# Patient Record
Sex: Female | Born: 1965 | Race: White | Hispanic: No | Marital: Married | Smoking: Current some day smoker
Health system: Southern US, Community
[De-identification: ages and names within clinical notes are randomized; demographics above are authoritative.]

## PROBLEM LIST (undated history)

## (undated) DIAGNOSIS — Z9889 Other specified postprocedural states: Secondary | ICD-10-CM

## (undated) DIAGNOSIS — K5190 Ulcerative colitis, unspecified, without complications: Secondary | ICD-10-CM

## (undated) DIAGNOSIS — Z8679 Personal history of other diseases of the circulatory system: Secondary | ICD-10-CM

## (undated) DIAGNOSIS — K5792 Diverticulitis of intestine, part unspecified, without perforation or abscess without bleeding: Secondary | ICD-10-CM

## (undated) DIAGNOSIS — N200 Calculus of kidney: Secondary | ICD-10-CM

## (undated) DIAGNOSIS — K5790 Diverticulosis of intestine, part unspecified, without perforation or abscess without bleeding: Secondary | ICD-10-CM

## (undated) DIAGNOSIS — F419 Anxiety disorder, unspecified: Secondary | ICD-10-CM

## (undated) HISTORY — DX: Ulcerative colitis, unspecified, without complications: K51.90

## (undated) HISTORY — DX: Diverticulosis of intestine, part unspecified, without perforation or abscess without bleeding: K57.90

## (undated) HISTORY — DX: Anxiety disorder, unspecified: F41.9

## (undated) HISTORY — DX: Calculus of kidney: N20.0

## (undated) HISTORY — DX: Diverticulitis of intestine, part unspecified, without perforation or abscess without bleeding: K57.92

## (undated) HISTORY — PX: OTHER SURGICAL HISTORY: SHX169

---

## 1998-09-05 ENCOUNTER — Other Ambulatory Visit: Admission: RE | Admit: 1998-09-05 | Discharge: 1998-09-05 | Payer: Self-pay | Admitting: Obstetrics & Gynecology

## 1999-09-09 ENCOUNTER — Other Ambulatory Visit: Admission: RE | Admit: 1999-09-09 | Discharge: 1999-09-09 | Payer: Self-pay | Admitting: Obstetrics & Gynecology

## 2000-09-14 ENCOUNTER — Other Ambulatory Visit: Admission: RE | Admit: 2000-09-14 | Discharge: 2000-09-14 | Payer: Self-pay | Admitting: Obstetrics & Gynecology

## 2001-10-04 ENCOUNTER — Other Ambulatory Visit: Admission: RE | Admit: 2001-10-04 | Discharge: 2001-10-04 | Payer: Self-pay | Admitting: Obstetrics & Gynecology

## 2002-09-05 ENCOUNTER — Other Ambulatory Visit: Admission: RE | Admit: 2002-09-05 | Discharge: 2002-09-05 | Payer: Self-pay | Admitting: Obstetrics & Gynecology

## 2003-09-06 ENCOUNTER — Other Ambulatory Visit: Admission: RE | Admit: 2003-09-06 | Discharge: 2003-09-06 | Payer: Self-pay | Admitting: Obstetrics & Gynecology

## 2004-09-13 ENCOUNTER — Other Ambulatory Visit: Admission: RE | Admit: 2004-09-13 | Discharge: 2004-09-13 | Payer: Self-pay | Admitting: Obstetrics & Gynecology

## 2005-02-18 ENCOUNTER — Ambulatory Visit: Payer: Self-pay | Admitting: Gastroenterology

## 2005-02-27 ENCOUNTER — Ambulatory Visit: Payer: Self-pay | Admitting: Gastroenterology

## 2005-03-03 ENCOUNTER — Encounter (INDEPENDENT_AMBULATORY_CARE_PROVIDER_SITE_OTHER): Payer: Self-pay | Admitting: Specialist

## 2005-03-03 ENCOUNTER — Ambulatory Visit: Payer: Self-pay | Admitting: Gastroenterology

## 2005-03-04 ENCOUNTER — Ambulatory Visit: Payer: Self-pay | Admitting: Gastroenterology

## 2005-04-01 ENCOUNTER — Ambulatory Visit: Payer: Self-pay | Admitting: Gastroenterology

## 2005-09-16 ENCOUNTER — Other Ambulatory Visit: Admission: RE | Admit: 2005-09-16 | Discharge: 2005-09-16 | Payer: Self-pay | Admitting: Obstetrics & Gynecology

## 2007-09-24 ENCOUNTER — Ambulatory Visit: Payer: Self-pay | Admitting: Gastroenterology

## 2007-09-24 LAB — CONVERTED CEMR LAB
ALT: 19 units/L (ref 0–35)
AST: 19 units/L (ref 0–37)
Albumin: 4.4 g/dL (ref 3.5–5.2)
BUN: 16 mg/dL (ref 6–23)
Basophils Relative: 0.7 % (ref 0.0–1.0)
CO2: 32 meq/L (ref 19–32)
Chloride: 104 meq/L (ref 96–112)
Creatinine, Ser: 0.7 mg/dL (ref 0.4–1.2)
Eosinophils Relative: 1.8 % (ref 0.0–5.0)
Ferritin: 21.1 ng/mL (ref 10.0–291.0)
HCT: 42 % (ref 36.0–46.0)
Hemoglobin: 13.9 g/dL (ref 12.0–15.0)
Iron: 76 ug/dL (ref 42–145)
Monocytes Absolute: 0.4 10*3/uL (ref 0.1–1.0)
Monocytes Relative: 5.1 % (ref 3.0–12.0)
Neutro Abs: 3.9 10*3/uL (ref 1.4–7.7)
RBC: 4.26 M/uL (ref 3.87–5.11)
Tissue Transglutaminase Ab, IgA: 0.7 units (ref <7)
WBC: 6.9 10*3/uL (ref 4.5–10.5)

## 2007-10-06 ENCOUNTER — Encounter: Payer: Self-pay | Admitting: Gastroenterology

## 2007-10-06 ENCOUNTER — Ambulatory Visit: Payer: Self-pay | Admitting: Gastroenterology

## 2010-11-05 NOTE — Assessment & Plan Note (Signed)
[facility]                         GASTROENTEROLOGY OFFICE NOTE   Debbie, [name]                      MRN:          [id number]  DATE:09/24/2007                            DOB:          1965-09-27    The patient has not been seen since September - October of 2006 when she  was felt to have possible underlying inflammatory bowel disease and was  treated with Pentasa with good response.  Actually on reviewing her  chart, her biopsies from March 03, 2005, showed changes of  lymphocytic colitis and her ileal biopsies were normal.  She does have a  family history of multiple family members with diverticulitis but no  known IBD.  Her IBD serologies at that time were positive and consistent  with Crohn's disease with a positive ASCA IgA.   She has continued with crampy abdominal pain and periodic diarrhea.  Most of her pain is in the left upper quadrant radiating to her back  with associated gas and bloating.  She has frequent soft stools and at  times does become constipated, but has denied melena or hematochezia.  No upper GI or hepatobiliary complaints, but has had some gradual slow  weight loss and general malaise.  A lot of her symptoms have been  exacerbated by stress at work.  She denies skin rash, joint pains, oral  stomatitis, or visual difficulty.  She has had no foreign travel, known  infectious disease exposure, or trauma to her side.  She relates  recently she was at [facility] and seen by a urologist and had  a negative CT scan of the abdomen.   The patient does use a large amount of NSAIDS which may be exacerbating  her problems.  Her health otherwise is really fairly unremarkable  although she is on Allegra twice a day, Tramadol p.r.n., and calcium  supplementation.   PHYSICAL EXAMINATION:  GENERAL:  She is an attractive, healthy appearing  white female in no distress appearing her stated age.  VITAL SIGNS:  She weighs  143 pounds which is down some 15 pounds from  her normal weight.  Blood pressure 116/76, and pulse 88 and regular.  I  could not appreciate stigmata of chronic liver disease.  ABDOMEN:  Entirely benign without organomegaly, masses, or tenderness,  or CVA tenderness.  Bowel sounds were not obstructive.  RECTAL:  Inspection of rectum was unremarkable as was rectal examination  with soft stool that was guaiac negative.   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   RECOMMENDATIONS:  1. Check multiple screening laboratory parameters including celiac      panel.  2. Follow-up colonoscopy examination and colon biopsies.  3. I have started Entocort 9 mg a day with p.r.n. Levsin and low fiber      diet.     Debbie Rea. [name], MD, [name], [name]  Electronically Signed    DRP/MedQ  DD: 09/24/2007  DT: 09/24/2007  Job #: [id number]

## 2012-06-19 ENCOUNTER — Emergency Department (HOSPITAL_BASED_OUTPATIENT_CLINIC_OR_DEPARTMENT_OTHER)
Admission: EM | Admit: 2012-06-19 | Discharge: 2012-06-19 | Disposition: A | Discharge status: Home / Self Care | Payer: BC Managed Care – PPO | Attending: Emergency Medicine | Admitting: Emergency Medicine

## 2012-06-19 ENCOUNTER — Emergency Department (HOSPITAL_BASED_OUTPATIENT_CLINIC_OR_DEPARTMENT_OTHER): Payer: BC Managed Care – PPO

## 2012-06-19 ENCOUNTER — Encounter (HOSPITAL_BASED_OUTPATIENT_CLINIC_OR_DEPARTMENT_OTHER): Payer: Self-pay | Admitting: *Deleted

## 2012-06-19 DIAGNOSIS — K5732 Diverticulitis of large intestine without perforation or abscess without bleeding: Secondary | ICD-10-CM | POA: Insufficient documentation

## 2012-06-19 DIAGNOSIS — F17200 Nicotine dependence, unspecified, uncomplicated: Secondary | ICD-10-CM | POA: Insufficient documentation

## 2012-06-19 DIAGNOSIS — Z87442 Personal history of urinary calculi: Secondary | ICD-10-CM | POA: Insufficient documentation

## 2012-06-19 DIAGNOSIS — Z79899 Other long term (current) drug therapy: Secondary | ICD-10-CM | POA: Insufficient documentation

## 2012-06-19 DIAGNOSIS — R110 Nausea: Secondary | ICD-10-CM | POA: Insufficient documentation

## 2012-06-19 DIAGNOSIS — Z3202 Encounter for pregnancy test, result negative: Secondary | ICD-10-CM | POA: Insufficient documentation

## 2012-06-19 DIAGNOSIS — Z8719 Personal history of other diseases of the digestive system: Secondary | ICD-10-CM | POA: Insufficient documentation

## 2012-06-19 HISTORY — DX: Personal history of other diseases of the circulatory system: Z86.79

## 2012-06-19 HISTORY — DX: Other specified postprocedural states: Z98.890

## 2012-06-19 LAB — CBC
MCH: 33.3 pg (ref 26.0–34.0)
MCHC: 33.8 g/dL (ref 30.0–36.0)
Platelets: 223 10*3/uL (ref 150–400)
RBC: 4.21 MIL/uL (ref 3.87–5.11)
RDW: 12.5 % (ref 11.5–15.5)

## 2012-06-19 LAB — COMPREHENSIVE METABOLIC PANEL
ALT: 19 U/L (ref 0–35)
AST: 22 U/L (ref 0–37)
Albumin: 4.8 g/dL (ref 3.5–5.2)
CO2: 26 mEq/L (ref 19–32)
Calcium: 10.1 mg/dL (ref 8.4–10.5)
Sodium: 138 mEq/L (ref 135–145)
Total Protein: 8.2 g/dL (ref 6.0–8.3)

## 2012-06-19 LAB — URINALYSIS, ROUTINE W REFLEX MICROSCOPIC
Glucose, UA: NEGATIVE mg/dL
Leukocytes, UA: NEGATIVE
Nitrite: NEGATIVE
Protein, ur: NEGATIVE mg/dL
pH: 6.5 (ref 5.0–8.0)

## 2012-06-19 LAB — URINE MICROSCOPIC-ADD ON

## 2012-06-19 LAB — PREGNANCY, URINE: Preg Test, Ur: NEGATIVE

## 2012-06-19 MED ORDER — ONDANSETRON HCL 4 MG/2ML IJ SOLN
4.0000 mg | Freq: Once | INTRAMUSCULAR | Status: AC
  Administered 2012-06-19: 4 mg via INTRAVENOUS
  Filled 2012-06-19: qty 2

## 2012-06-19 MED ORDER — SODIUM CHLORIDE 0.9 % IV BOLUS (SEPSIS)
500.0000 mL | Freq: Once | INTRAVENOUS | Status: AC
  Administered 2012-06-19: 17:00:00 via INTRAVENOUS

## 2012-06-19 MED ORDER — IOHEXOL 300 MG/ML  SOLN
25.0000 mL | INTRAMUSCULAR | Status: AC
  Administered 2012-06-19 (×2): 25 mL via INTRAVENOUS

## 2012-06-19 MED ORDER — METRONIDAZOLE 500 MG PO TABS: 500.0000 mg | ORAL_TABLET | Freq: Two times a day (BID) | ORAL | Status: DC

## 2012-06-19 MED ORDER — IOHEXOL 300 MG/ML  SOLN
100.0000 mL | Freq: Once | INTRAMUSCULAR | Status: AC | PRN
  Administered 2012-06-19: 100 mL via INTRAVENOUS

## 2012-06-19 MED ORDER — HYDROMORPHONE HCL PF 1 MG/ML IJ SOLN
1.0000 mg | Freq: Once | INTRAMUSCULAR | Status: AC
  Administered 2012-06-19: 1 mg via INTRAVENOUS
  Filled 2012-06-19: qty 1

## 2012-06-19 MED ORDER — CIPROFLOXACIN HCL 500 MG PO TABS: 500.0000 mg | ORAL_TABLET | Freq: Two times a day (BID) | ORAL | Status: DC

## 2012-06-19 MED ORDER — OXYCODONE-ACETAMINOPHEN 5-325 MG PO TABS: 2.0000 | ORAL_TABLET | ORAL | Status: DC | PRN

## 2012-06-19 NOTE — ED Notes (Addendum)
Pt c/o left flank pain radiating to LLQ onset this a.m. "Feels like I have to have a BM" Given strainer. Hurts to laugh, cough, etc.

## 2012-06-20 LAB — URINE CULTURE: Colony Count: 50000

## 2012-06-20 NOTE — ED Provider Notes (Signed)
History     CSN: [id number]  Arrival date & time 06/19/12  1524   First MD Initiated Contact with Patient 06/19/12 1551      Chief Complaint  Patient presents with  . Flank Pain    (Consider location/radiation/quality/duration/timing/severity/associated sxs/prior treatment) Patient is a 46 y.o. female presenting with flank pain. The history is provided by the patient.  Flank Pain This is a new problem. The current episode started today. The problem occurs intermittently. The problem has been unchanged. Associated symptoms include abdominal pain and nausea. Pertinent negatives include no chest pain, coughing, fever or vomiting. She has tried nothing for the symptoms. The treatment provided moderate relief.   left flank to left lower quadrant pain that started this morning. States that pain comes in waves she feels like she is to have a bowel movement. Patient has nausea but no vomiting. Denies fever. Remote history of a kidney stone. Remote history of colitis as well. Non toxic appearance.  Past Medical History  Diagnosis Date  . Renal disorder   . S/P ablation operation for arrhythmia     History reviewed. No pertinent past surgical history.  History reviewed. No pertinent family history.  History  Substance Use Topics  . Smoking status: Current Some Day Smoker  . Smokeless tobacco: Not on file  . Alcohol Use: Yes    OB History    Grav Para Term Preterm Abortions TAB SAB Ect Mult Living                  Review of Systems  Constitutional: Negative.  Negative for fever.  HENT: Negative.   Eyes: Negative.   Respiratory: Negative.  Negative for cough and shortness of breath.   Cardiovascular: Negative.  Negative for chest pain.  Gastrointestinal: Positive for nausea and abdominal pain. Negative for vomiting, diarrhea, blood in stool and abdominal distention.       L side pain LLQ pain  Genitourinary: Positive for flank pain.  Neurological: Negative.     Psychiatric/Behavioral: Negative.   All other systems reviewed and are negative.    Allergies  Sulfa antibiotics  Home Medications   Current Outpatient Rx  Name  Route  Sig  Dispense  Refill  . SERTRALINE HCL PO   Oral   Take 75 mg by mouth daily at 6 (six) AM.         . CIPROFLOXACIN HCL 500 MG PO TABS   Oral   Take 1 tablet (500 mg total) by mouth every 12 (twelve) hours.   10 tablet   0   . METRONIDAZOLE 500 MG PO TABS   Oral   Take 1 tablet (500 mg total) by mouth 2 (two) times daily.   14 tablet   0   . OXYCODONE-ACETAMINOPHEN 5-325 MG PO TABS   Oral   Take 2 tablets by mouth every 4 (four) hours as needed for pain.   6 tablet   0     BP 109/67  Pulse 74  Temp 98 F (36.7 C) (Oral)  Resp 18  Ht 5' 7.5" (1.715 m)  Wt 166 lb (75.297 kg)  BMI 25.62 kg/m2  SpO2 99%  Physical Exam  Nursing note and vitals reviewed. Constitutional: She is oriented to person, place, and time. She appears well-developed and well-nourished.  HENT:  Head: Normocephalic and atraumatic.  Eyes: Conjunctivae normal and EOM are normal. Pupils are equal, round, and reactive to light.  Neck: Normal range of motion. Neck supple.  Cardiovascular: Normal  rate.   Pulmonary/Chest: Effort normal and breath sounds normal.  Abdominal: Soft. Bowel sounds are normal. She exhibits no distension. There is tenderness. There is no rebound and no guarding.       Tenderness to LLQ No CVA tenderness  Musculoskeletal: Normal range of motion. She exhibits no edema and no tenderness.  Neurological: She is alert and oriented to person, place, and time. She has normal reflexes.  Skin: Skin is warm and dry.  Psychiatric: She has a normal mood and affect.    ED Course  Procedures (including critical care time)  Labs Reviewed  URINALYSIS, ROUTINE W REFLEX MICROSCOPIC - Abnormal; Notable for the following:    Hgb urine dipstick SMALL (*)     All other components within normal limits  PREGNANCY,  URINE  URINE MICROSCOPIC-ADD ON  CBC  COMPREHENSIVE METABOLIC PANEL  URINE CULTURE   Ct Abdomen Pelvis W Contrast  06/19/2012  *RADIOLOGY REPORT*  Clinical Data: Left side abdominal pain  CT ABDOMEN AND PELVIS WITH CONTRAST  Technique:  Multidetector CT imaging of the abdomen and pelvis was performed following the standard protocol during bolus administration of intravenous contrast.  Contrast: OMNIPAQUE IOHEXOL 300 MG/ML  SOLN  Comparison: None.  Findings: Lung bases are clear.  No pericardial fluid.  No focal hepatic lesion.  The gallbladder, pancreas, spleen, adrenal glands, and kidneys are normal.  The stomach, small bowel, and cecum are normal.  Appendix is partially imaged and appears normal.  Several diverticula of the descending colon without acute inflammation. In the sigmoid colon, there is mild peri colonic stranding within the mesentery best seen on the sagittal coronal projections (image 66 and 61).  Several diverticula through this region.  Abdominal aorta normal caliber.  No retroperitoneal periportal lymphadenopathy.  No peritoneal disease.  No free fluid the pelvis.  The uterus and ovaries are normal. Bladder is normal.  No pelvic lymphadenopathy. Review of  bone windows demonstrates no aggressive osseous lesions.  IMPRESSION: Mild segmental colitis of the sigmoid colon versus mild diverticulitis.  This is a subtle finding.   Original Report Authenticated By: Genevive Bi, M.D.      1. Diverticulitis of sigmoid colon       MDM  Diverticulitis with WBC 10.3.  Hgb 14. No acute pain or dizziness.  CT of abd and pelvis reviewed by myself shows mild colitis vs diverticulitis of the sigmoid colon. rx for flagyl and cipro.  She will follow up with GI this week.  No fever.  Labs Reviewed  URINALYSIS, ROUTINE W REFLEX MICROSCOPIC - Abnormal; Notable for the following:    Hgb urine dipstick SMALL (*)     All other components within normal limits  PREGNANCY, URINE  URINE  MICROSCOPIC-ADD ON  CBC  COMPREHENSIVE METABOLIC PANEL  URINE CULTURE         Remi Haggard, NP 06/21/12 1137

## 2012-06-21 ENCOUNTER — Encounter: Payer: Self-pay | Admitting: Gastroenterology

## 2012-06-24 NOTE — ED Provider Notes (Signed)
Medical screening examination/treatment/procedure(s) were performed by non-physician practitioner and as supervising physician I was immediately available for consultation/collaboration.  Martha K Linker, MD 06/24/12 0704 

## 2012-07-01 ENCOUNTER — Encounter: Payer: Self-pay | Admitting: *Deleted

## 2012-07-08 ENCOUNTER — Encounter: Payer: Self-pay | Admitting: Gastroenterology

## 2012-07-08 ENCOUNTER — Ambulatory Visit (INDEPENDENT_AMBULATORY_CARE_PROVIDER_SITE_OTHER): Payer: BC Managed Care – PPO | Admitting: Gastroenterology

## 2012-07-08 VITALS — BP 110/76 | HR 74 | Ht 67.0 in | Wt 172.1 lb

## 2012-07-08 DIAGNOSIS — K5732 Diverticulitis of large intestine without perforation or abscess without bleeding: Secondary | ICD-10-CM

## 2012-07-08 DIAGNOSIS — Z86010 Personal history of colonic polyps: Secondary | ICD-10-CM

## 2012-07-08 DIAGNOSIS — K5792 Diverticulitis of intestine, part unspecified, without perforation or abscess without bleeding: Secondary | ICD-10-CM

## 2012-07-08 NOTE — Patient Instructions (Addendum)
You have been scheduled for a colonoscopy with propofol. Please follow written instructions given to you at your visit today.  Please pick up your prep kit at the pharmacy within the next 1-3 days. If you use inhalers (even only as needed) or a CPAP machine, please bring them with you on the day of your procedure.  Use Fiber Supplements Use Metamucil daily

## 2012-07-08 NOTE — Progress Notes (Signed)
History of Present Illness:  This is a  47 year old Caucasian female with recent episode on December 28 of acute diverticulitis treated at the local emergency room with antibiotics.  Currently is asymptomatic and denies any GI complaints except for mild constipation.  She specifically denies rectal bleeding, melena, upper GI or hepatobiliary complaints.  I did see her in April of 2009 removed a left colon adenoma.  Family history is noncontributory.  The patient denies any food intolerances, weight loss, or other medical illnesses except for mild depression.  I have reviewed this patient's present history, medical and surgical past history, allergies and medications.     ROS: The remainder of the 10 point ROS is negative     Physical Exam: Blood pressure 110/76, pulse 74 and regular, and weight 172 with a BMI of 26.96. General well developed well nourished patient in no acute distress, appearing their stated age Eyes PERRLA, no icterus, fundoscopic exam per opthamologist Skin no lesions noted Neck supple, no adenopathy, no thyroid enlargement, no tenderness Chest clear to percussion and auscultation Heart no significant murmurs, gallops or rubs noted Abdomen no hepatosplenomegaly masses or tenderness, BS normal.  Extremities no acute joint lesions, edema, phlebitis or evidence of cellulitis. Neurologic patient oriented x 3, cranial nerves intact, no focal neurologic deficits noted. Psychological mental status normal and normal affect.  Assessment and plan: Status post episode of acute diverticulitis one month ago.  I placed this patient on a high fiber diet with daily Metamucil and liberal by mouth fluids.  She saw our patient education video on diverticulitis and is management.  Colonoscopy one month's time scheduled for followup of her previous colon polyp.  Review of her labs shows no specific abnormalities.  CT scan labs reviewed today.  Encounter Diagnosis  Name Primary?  .  Diverticulitis Yes

## 2012-07-12 ENCOUNTER — Telehealth: Payer: Self-pay | Admitting: Gastroenterology

## 2012-07-12 MED ORDER — MOVIPREP 100 G PO SOLR: 1.0000 | Freq: Once | ORAL | Status: DC

## 2012-07-12 NOTE — Telephone Encounter (Signed)
Rx sent. Patient notified. 

## 2012-08-09 ENCOUNTER — Encounter: Payer: Self-pay | Admitting: Gastroenterology

## 2012-08-09 ENCOUNTER — Ambulatory Visit (AMBULATORY_SURGERY_CENTER): Payer: BC Managed Care – PPO | Admitting: Gastroenterology

## 2012-08-09 VITALS — BP 119/66 | HR 72 | Temp 96.9°F | Resp 28 | Ht 67.0 in | Wt 172.0 lb

## 2012-08-09 DIAGNOSIS — D126 Benign neoplasm of colon, unspecified: Secondary | ICD-10-CM

## 2012-08-09 DIAGNOSIS — Z86010 Personal history of colonic polyps: Secondary | ICD-10-CM

## 2012-08-09 DIAGNOSIS — K5730 Diverticulosis of large intestine without perforation or abscess without bleeding: Secondary | ICD-10-CM

## 2012-08-09 MED ORDER — SODIUM CHLORIDE 0.9 % IV SOLN: 500.0000 mL | INTRAVENOUS | Status: DC

## 2012-08-09 NOTE — Progress Notes (Signed)
Patient did not experience any of the following events: a burn prior to discharge; a fall within the facility; wrong site/side/patient/procedure/implant event; or a hospital transfer or hospital admission upon discharge from the facility. (G8907) Patient did not have preoperative order for IV antibiotic SSI prophylaxis. (G8918)  

## 2012-08-09 NOTE — Patient Instructions (Addendum)
YOU HAD AN ENDOSCOPIC PROCEDURE TODAY AT [facility]: Refer to the procedure report that was given to you for any specific questions about what was found during the examination.  If the procedure report does not answer your questions, please call your gastroenterologist to clarify.  If you requested that your care partner not be given the details of your procedure findings, then the procedure report has been included in a sealed envelope for you to review at your convenience later.  YOU SHOULD EXPECT: Some feelings of bloating in the abdomen. Passage of more gas than usual.  Walking can help get rid of the air that was put into your GI tract during the procedure and reduce the bloating. If you had a lower endoscopy (such as a colonoscopy or flexible sigmoidoscopy) you may notice spotting of blood in your stool or on the toilet paper. If you underwent a bowel prep for your procedure, then you may not have a normal bowel movement for a few days.  DIET: Your first meal following the procedure should be a light meal and then it is ok to progress to your normal diet.  A half-sandwich or bowl of soup is an example of a good first meal.  Heavy or fried foods are harder to digest and may make you feel nauseous or bloated.  Likewise meals heavy in dairy and vegetables can cause extra gas to form and this can also increase the bloating.  Drink plenty of fluids but you should avoid alcoholic beverages for 24 hours.  ACTIVITY: Your care partner should take you home directly after the procedure.  You should plan to take it easy, moving slowly for the rest of the day.  You can resume normal activity the day after the procedure however you should NOT DRIVE or use heavy machinery for 24 hours (because of the sedation medicines used during the test).    SYMPTOMS TO REPORT IMMEDIATELY: A gastroenterologist can be reached at any hour.  During normal business hours, 8:30 AM to 5:00 PM Monday through Friday,  call [phone].  After hours and on weekends, please call the GI answering service at [phone] who will take a message and have the physician on call contact you.   Following lower endoscopy (colonoscopy or flexible sigmoidoscopy):  Excessive amounts of blood in the stool  Significant tenderness or worsening of abdominal pains  Swelling of the abdomen that is new, acute  Fever of 100F or higher s  FOLLOW UP: If any biopsies were taken you will be contacted by phone or by letter within the next 1-3 weeks.  Call your gastroenterologist if you have not heard about the biopsies in 3 weeks.  Our staff will call the home number listed on your records the next business day following your procedure to check on you and address any questions or concerns that you may have at that time regarding the information given to you following your procedure. This is a courtesy call and so if there is no answer at the home number and we have not heard from you through the emergency physician on call, we will assume that you have returned to your regular daily activities without incident.  SIGNATURES/CONFIDENTIALITY: You and/or your care partner have signed paperwork which will be entered into your electronic medical record.  These signatures attest to the fact that that the information above on your After Visit Summary has been reviewed and is understood.  Full responsibility of the confidentiality of  this discharge information lies with you and/or your care-partner.  Diverticulosis and polyp information given.   Repeat colonoscopy will depend upon type of polyp, Dr. [name] will advise.  Use Miralax as needed for constipation.

## 2012-08-09 NOTE — Op Note (Signed)
[facility] [address] [place], [address]   COLONOSCOPY PROCEDURE REPORT  PATIENT: Debbie [name], Debbie [name]  MR#: [id number] BIRTHDATE: 1965/07/23 , 47  yrs. old GENDER: Female ENDOSCOPIST: Mardella Layman, MD, [facility] REFERRED BY: PROCEDURE DATE:  08/09/2012 PROCEDURE:   Colonoscopy with biopsy and snare polypectomy and Colonoscopy with biopsy ASA CLASS:   Class II INDICATIONS:Patient's personal history of colon polyps and abdominal pain in the lower left quadrant. MEDICATIONS: propofol (Diprivan) 350mg  IV  DESCRIPTION OF PROCEDURE:   After the risks and benefits and of the procedure were explained, informed consent was obtained.  A digital rectal exam revealed no abnormalities of the rectum.    The LB [id number]  endoscope was introduced through the anus and advanced to the cecum, which was identified by both the appendix and ileocecal valve .  The quality of the prep was excellent, using MoviPrep .  The instrument was then slowly withdrawn as the colon was fully examined.     COLON FINDINGS: A smooth flat polyp ranging between 3-6mm in size was found at the hepatic flexure.  A polypectomy was performed with a cold snare.  The resection was complete and the polyp tissue was completely retrieved. Small sigmmoiid 2 mm polypd cold biopsy removed,jar #2.A single sigmoid diverticulum noted,no inflammation or colitis.    Retroflexion was not performed.     The scope was then withdrawn from the patient and the procedure completed.  COMPLICATIONS: There were no complications. ENDOSCOPIC IMPRESSION: Flat polyp ranging between 3-10mm in size was found at the hepatic flexure; polypectomy was performed with a cold snare .Small sigmoid polyp removed.Minimal sigmoid diverticulosis  noted.  RECOMMENDATIONS: 1.  Await pathology results 2.  Repeat colonoscopy in 5 years if polyp adenomatous; otherwise 10 years 3.  Titrate to need   REPEAT  EXAM:  cc:  _______________________________ eSigned:  Mardella Layman, MD, [facility] 08/09/2012 2:13 PM

## 2012-08-10 ENCOUNTER — Telehealth: Payer: Self-pay | Admitting: *Deleted

## 2012-08-10 NOTE — Telephone Encounter (Signed)
  Follow up Call-  Call back number 08/09/2012  Post procedure Call Back phone  # [phone]  Permission to leave phone message Yes     Patient questions:  Do you have a fever, pain , or abdominal swelling? no Pain Score  0 *  Have you tolerated food without any problems? yes  Have you been able to return to your normal activities? yes  Do you have any questions about your discharge instructions: Diet   no Medications  no Follow up visit  no  Do you have questions or concerns about your Care? no  Actions: * If pain score is 4 or above: No action needed, pain <4.

## 2012-08-16 ENCOUNTER — Encounter: Payer: Self-pay | Admitting: Gastroenterology

## 2012-12-07 ENCOUNTER — Other Ambulatory Visit: Payer: Self-pay

## 2013-10-24 IMAGING — CT CT ABD-PELV W/ CM
2 of 5 series · 16 of 46 positions shown, 18 images · IV contrast (APPLIED)
Comparison: None.

CLINICAL DATA: Left side abdominal pain

CT ABDOMEN AND PELVIS WITH CONTRAST
TECHNIQUE: Multidetector CT imaging of the abdomen and pelvis was
performed following the standard protocol during bolus
administration of intravenous contrast.
Contrast: 100mL OMNIPAQUE IOHEXOL 300 MG/ML  SOLN

[Series 2: abd/pelvis 5.0 b31f · axial · 0.73mm/px · z∈[-518,-108]mm · 13 of 92 slices shown, 15 images]
[im 5/92  soft-tissue]
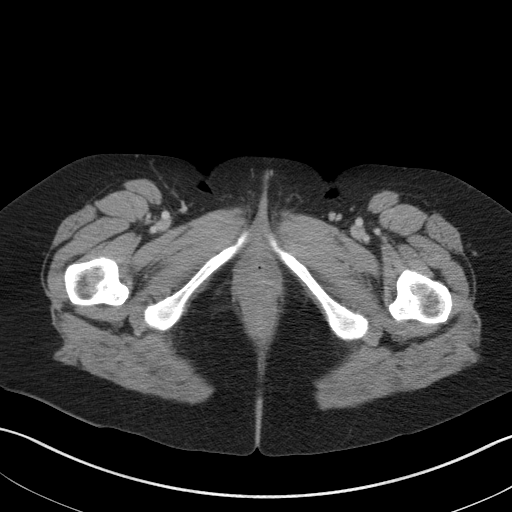
[im 5/92  bone]
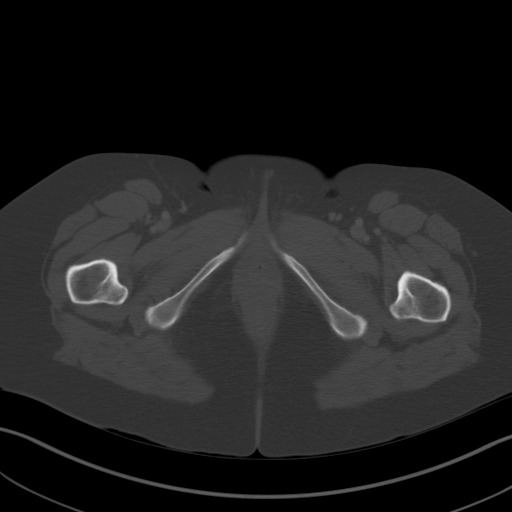
[im 14/92  soft-tissue]
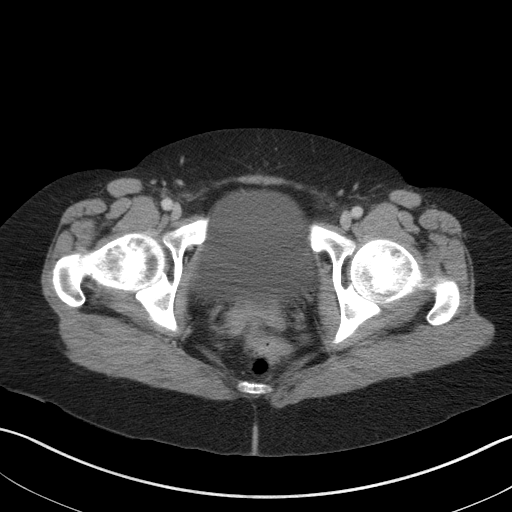
[im 19/92  soft-tissue]
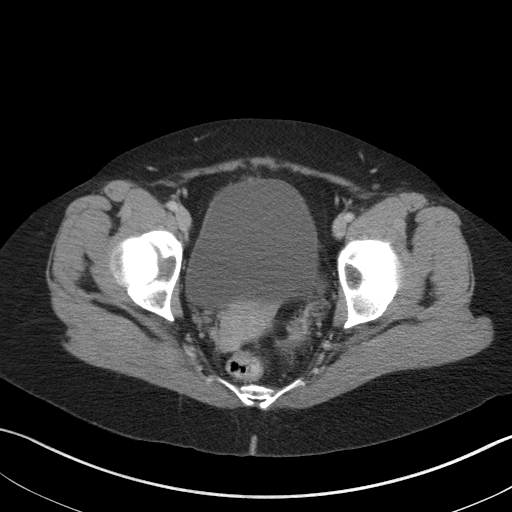
[im 28/92  soft-tissue]
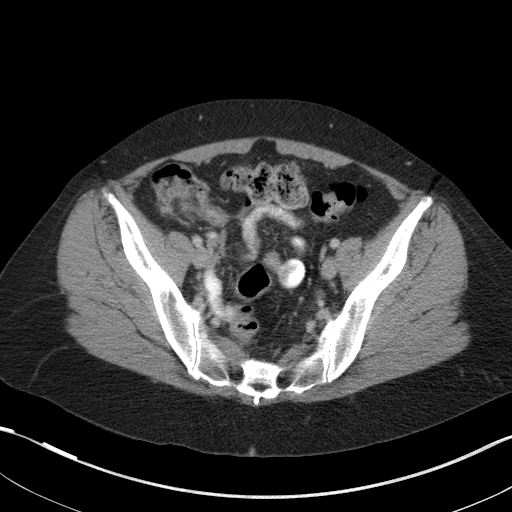
[im 32/92  soft-tissue]
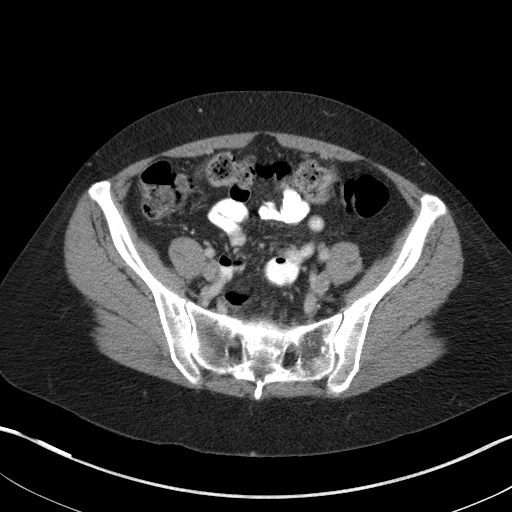
[im 41/92  soft-tissue]
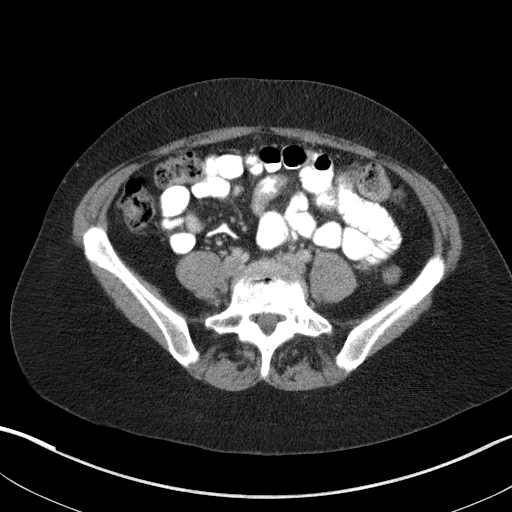
[im 46/92  soft-tissue]
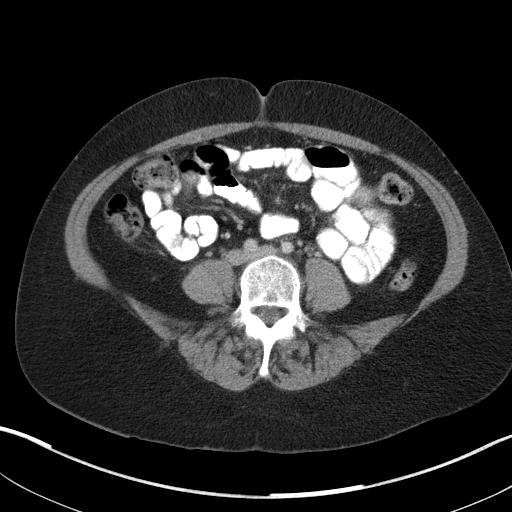
[im 51/92  soft-tissue]
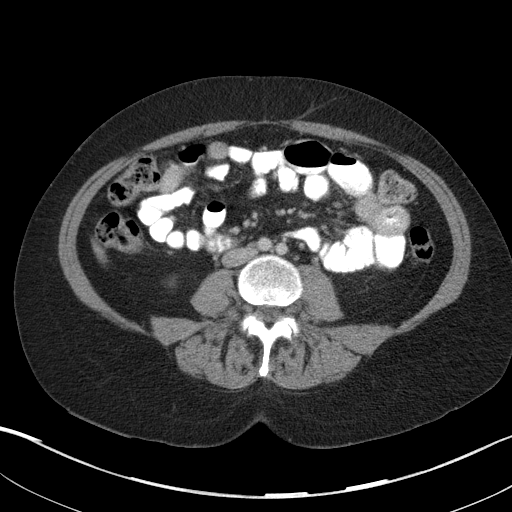
[im 60/92  soft-tissue]
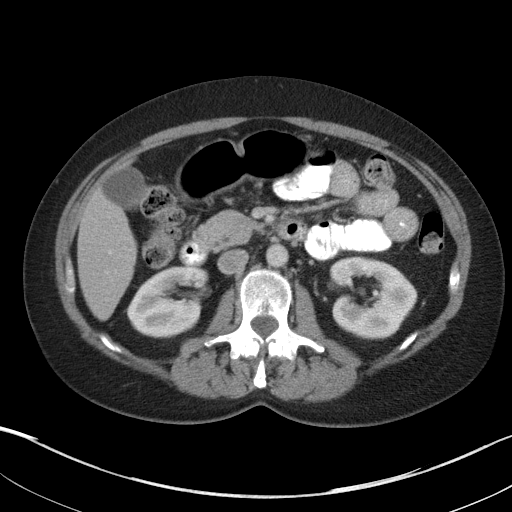
[im 60/92  bone]
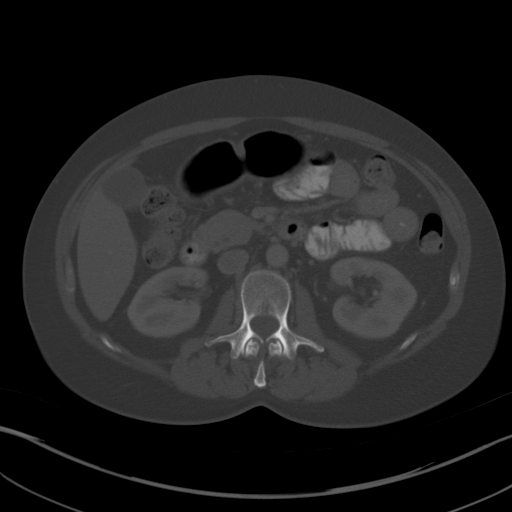
[im 64/92  soft-tissue]
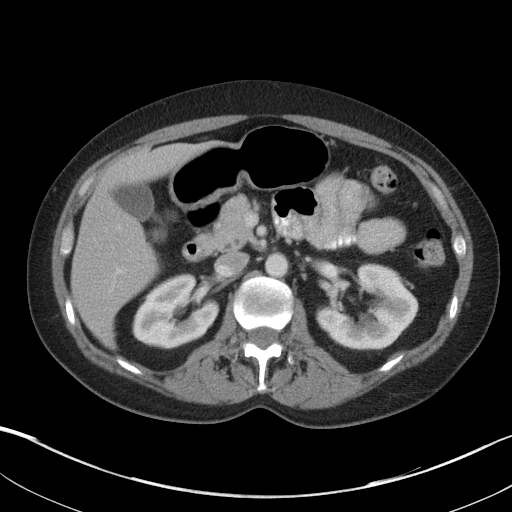
[im 73/92  soft-tissue]
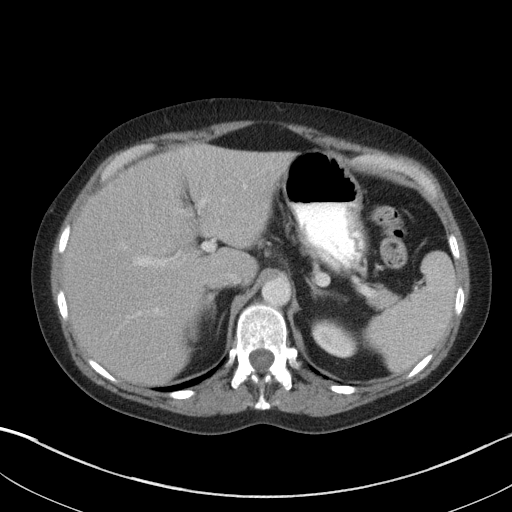
[im 78/92  soft-tissue]
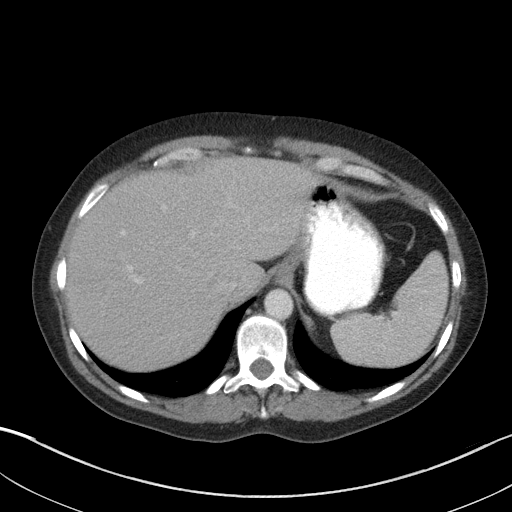
[im 87/92  soft-tissue]
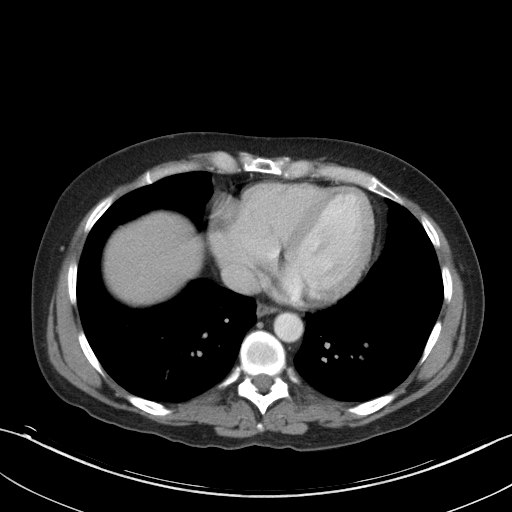

[Series 5: abd/pelvis 3.0 coronal · coronal · 0.88mm/px · 3 of 76 slices shown]
[im 26/76  soft-tissue]
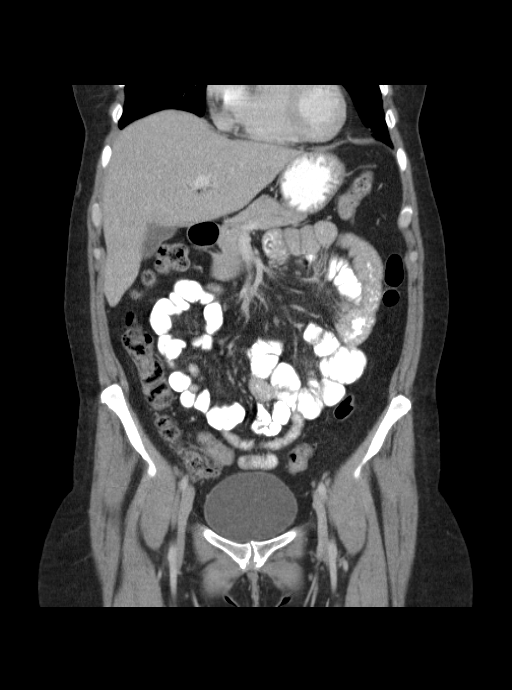
[im 34/76  soft-tissue]
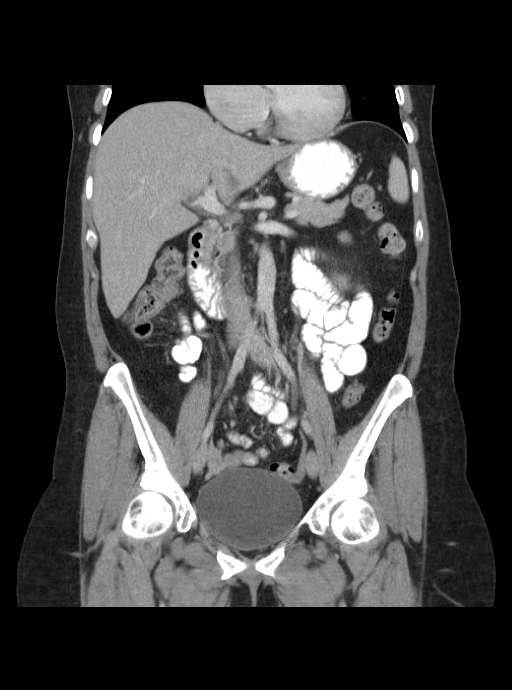
[im 42/76  soft-tissue]
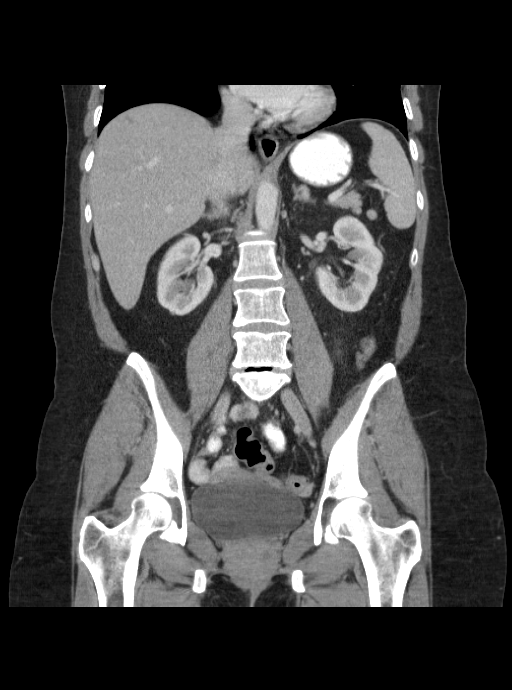

[16 of 46 positions shown; findings below may reference images not displayed]

FINDINGS: Lung bases are clear.  No pericardial fluid.

No focal hepatic lesion.  The gallbladder, pancreas, spleen,
adrenal glands, and kidneys are normal.

The stomach, small bowel, and cecum are normal.  Appendix is
partially imaged and appears normal.  Several diverticula of the
descending colon without acute inflammation. In the sigmoid colon,
there is mild peri colonic stranding within the mesentery best seen
on the sagittal coronal projections (image 66 and 61).  Several
diverticula through this region.

Abdominal aorta normal caliber.  No retroperitoneal periportal
lymphadenopathy.  No peritoneal disease.

No free fluid the pelvis.  The uterus and ovaries are normal.
Bladder is normal.  No pelvic lymphadenopathy. Review of  bone
windows demonstrates no aggressive osseous lesions.
IMPRESSION: Mild segmental colitis of the sigmoid colon versus mild
diverticulitis.  This is a subtle finding.

## 2014-08-25 ENCOUNTER — Emergency Department (HOSPITAL_BASED_OUTPATIENT_CLINIC_OR_DEPARTMENT_OTHER)
Admission: EM | Admit: 2014-08-25 | Discharge: 2014-08-25 | Disposition: A | Discharge status: Home / Self Care | Payer: 59 | Attending: Emergency Medicine | Admitting: Emergency Medicine

## 2014-08-25 ENCOUNTER — Encounter (HOSPITAL_BASED_OUTPATIENT_CLINIC_OR_DEPARTMENT_OTHER): Payer: Self-pay | Admitting: *Deleted

## 2014-08-25 DIAGNOSIS — S68123A Partial traumatic metacarpophalangeal amputation of left middle finger, initial encounter: Secondary | ICD-10-CM | POA: Diagnosis not present

## 2014-08-25 DIAGNOSIS — Y288XXA Contact with other sharp object, undetermined intent, initial encounter: Secondary | ICD-10-CM | POA: Insufficient documentation

## 2014-08-25 DIAGNOSIS — F419 Anxiety disorder, unspecified: Secondary | ICD-10-CM | POA: Diagnosis not present

## 2014-08-25 DIAGNOSIS — Y998 Other external cause status: Secondary | ICD-10-CM | POA: Insufficient documentation

## 2014-08-25 DIAGNOSIS — Z8719 Personal history of other diseases of the digestive system: Secondary | ICD-10-CM | POA: Diagnosis not present

## 2014-08-25 DIAGNOSIS — Y9389 Activity, other specified: Secondary | ICD-10-CM | POA: Diagnosis not present

## 2014-08-25 DIAGNOSIS — S68119A Complete traumatic metacarpophalangeal amputation of unspecified finger, initial encounter: Secondary | ICD-10-CM

## 2014-08-25 DIAGNOSIS — Y9289 Other specified places as the place of occurrence of the external cause: Secondary | ICD-10-CM | POA: Diagnosis not present

## 2014-08-25 DIAGNOSIS — Z79899 Other long term (current) drug therapy: Secondary | ICD-10-CM | POA: Insufficient documentation

## 2014-08-25 DIAGNOSIS — Z87442 Personal history of urinary calculi: Secondary | ICD-10-CM | POA: Diagnosis not present

## 2014-08-25 DIAGNOSIS — Z720 Tobacco use: Secondary | ICD-10-CM | POA: Diagnosis not present

## 2014-08-25 DIAGNOSIS — S6992XA Unspecified injury of left wrist, hand and finger(s), initial encounter: Secondary | ICD-10-CM | POA: Diagnosis present

## 2014-08-25 MED ORDER — HYDROCODONE-ACETAMINOPHEN 5-325 MG PO TABS
1.0000 | ORAL_TABLET | Freq: Once | ORAL | Status: AC
  Administered 2014-08-25: 1 via ORAL
  Filled 2014-08-25: qty 1

## 2014-08-25 MED ORDER — HYDROCODONE-ACETAMINOPHEN 5-325 MG PO TABS: 1.0000 | ORAL_TABLET | Freq: Four times a day (QID) | ORAL | Status: AC | PRN

## 2014-08-25 NOTE — ED Notes (Signed)
Pt reports she was cutting onions last night and cut her left hand, third finger. Reports that the laceration will not stop bleeding.

## 2014-08-25 NOTE — ED Provider Notes (Signed)
CSN: [id number]     Arrival date & time 08/25/14  0622 History   First MD Initiated Contact with Patient 08/25/14 0636     Chief Complaint  Patient presents with  . Finger Injury     (Consider location/radiation/quality/duration/timing/severity/associated sxs/prior Treatment) HPI This is a 49 year old female who was slicing onions just evening about 7 PM when she sliced off the tip of her left middle finger. She irrigated the wound well and applied hydroxide peroxide. Despite bandaging the wound has continued bleeding. The bleeding is not heavy but persistent. She is having severe pain at the wound site. She states she is up-to-date on her tetanus.  Past Medical History  Diagnosis Date  . Kidney stone   . S/P ablation operation for arrhythmia   . Diverticulitis   . Diverticulosis   . Anxiety   . UC (ulcerative colitis)    Past Surgical History  Procedure Laterality Date  . Svt ablation     Family History  Problem Relation Age of Onset  . Ulcerative colitis Mother   . Diverticulosis Brother     x 2  . Diverticulosis Maternal Grandmother   . Diverticulosis Maternal Aunt   . Lung cancer Brother    History  Substance Use Topics  . Smoking status: Current Some Day Smoker    Types: Cigarettes  . Smokeless tobacco: Never Used     Comment: Currently trying to quit.  Only smokes about 1 cig/day  . Alcohol Use: Yes     Comment: 2-3 per week   OB History    No data available     Review of Systems  All other systems reviewed and are negative.   Allergies  Oxycodone and Sulfa antibiotics  Home Medications   Prior to Admission medications   Medication Sig Start Date End Date Taking? Authorizing Provider  sertraline (ZOLOFT) 50 MG tablet Take 50 mg by mouth daily.    Historical Provider, MD   BP 139/84 mmHg  Pulse 80  Temp(Src) 98.2 F (36.8 C) (Oral)  Resp 18  SpO2 98%   Physical Exam  General: Well-developed, well-nourished female in no acute distress;  appearance consistent with age of record HENT: normocephalic; atraumatic Eyes: pupils equal, round and reactive to light; extraocular muscles intact Neck: supple Heart: regular rate and rhythm Lungs: clear to auscultation bilaterally Abdomen: soft; nondistended; nontender Extremities: No deformity; full range of motion Neurologic: Awake, alert and oriented; motor function intact in all extremities and symmetric; no facial droop Skin: Warm and dry; amputation of tip of left middle finger approximately 0.4 centimeters by 2.6 centimeters Psychiatric: Normal mood and affect    ED Course  Procedures (including critical care time)   MDM  6:42 AM Hemostasis achieved with application of WoundSeal. Finger bandaged.    Wynetta Fines, MD 08/25/14 [phone]

## 2024-02-01 ENCOUNTER — Other Ambulatory Visit (HOSPITAL_BASED_OUTPATIENT_CLINIC_OR_DEPARTMENT_OTHER): Payer: Self-pay | Admitting: Family Medicine

## 2024-02-01 DIAGNOSIS — E782 Mixed hyperlipidemia: Secondary | ICD-10-CM

## 2024-02-04 ENCOUNTER — Ambulatory Visit (HOSPITAL_BASED_OUTPATIENT_CLINIC_OR_DEPARTMENT_OTHER)
Admission: RE | Admit: 2024-02-04 | Discharge: 2024-02-04 | Disposition: A | Discharge status: Home / Self Care | Payer: Self-pay | Source: Ambulatory Visit | Attending: Family Medicine | Admitting: Family Medicine

## 2024-02-04 DIAGNOSIS — E782 Mixed hyperlipidemia: Secondary | ICD-10-CM | POA: Insufficient documentation
